# Patient Record
Sex: Male | Born: 1955 | Race: White | Hispanic: No | Marital: Married | Smoking: Former smoker
Health system: Southern US, Community
[De-identification: ages and names within clinical notes are randomized; demographics above are authoritative.]

## PROBLEM LIST (undated history)

## (undated) DIAGNOSIS — I2510 Atherosclerotic heart disease of native coronary artery without angina pectoris: Secondary | ICD-10-CM

## (undated) DIAGNOSIS — IMO0002 Reserved for concepts with insufficient information to code with codable children: Secondary | ICD-10-CM

## (undated) DIAGNOSIS — G8929 Other chronic pain: Secondary | ICD-10-CM

## (undated) DIAGNOSIS — M549 Dorsalgia, unspecified: Secondary | ICD-10-CM

## (undated) DIAGNOSIS — F419 Anxiety disorder, unspecified: Secondary | ICD-10-CM

## (undated) DIAGNOSIS — M1990 Unspecified osteoarthritis, unspecified site: Secondary | ICD-10-CM

## (undated) HISTORY — DX: Unspecified osteoarthritis, unspecified site: M19.90

## (undated) HISTORY — PX: FOOT SURGERY: SHX648

## (undated) HISTORY — DX: Other chronic pain: G89.29

## (undated) HISTORY — DX: Dorsalgia, unspecified: M54.9

## (undated) HISTORY — DX: Anxiety disorder, unspecified: F41.9

## (undated) HISTORY — DX: Reserved for concepts with insufficient information to code with codable children: IMO0002

## (undated) HISTORY — DX: Atherosclerotic heart disease of native coronary artery without angina pectoris: I25.10

---

## 2010-12-25 ENCOUNTER — Emergency Department (HOSPITAL_COMMUNITY): Payer: PRIVATE HEALTH INSURANCE

## 2010-12-25 ENCOUNTER — Encounter: Payer: Self-pay | Admitting: *Deleted

## 2010-12-25 ENCOUNTER — Other Ambulatory Visit: Payer: Self-pay

## 2010-12-25 ENCOUNTER — Emergency Department (HOSPITAL_COMMUNITY)
Admission: EM | Admit: 2010-12-25 | Discharge: 2010-12-26 | Disposition: A | Discharge status: Home / Self Care | Payer: PRIVATE HEALTH INSURANCE | Attending: Emergency Medicine | Admitting: Emergency Medicine

## 2010-12-25 DIAGNOSIS — R55 Syncope and collapse: Secondary | ICD-10-CM | POA: Insufficient documentation

## 2010-12-25 DIAGNOSIS — R002 Palpitations: Secondary | ICD-10-CM | POA: Insufficient documentation

## 2010-12-25 LAB — CBC
HCT: 43.2 % (ref 39.0–52.0)
Hemoglobin: 15 g/dL (ref 13.0–17.0)
MCH: 29.8 pg (ref 26.0–34.0)
MCHC: 34.7 g/dL (ref 30.0–36.0)
MCV: 85.7 fL (ref 78.0–100.0)
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
## 2010-12-25 NOTE — ED Notes (Signed)
Patient states was walking and felt heart begin racing and then he woke up on the ground. A&O; skin w/d. Respirations even and unlabored;able to speak in complete sentences without difficulty.

## 2010-12-25 NOTE — ED Notes (Signed)
Patient placed on monitor; sinus tachycardia with rate of 102.

## 2010-12-26 LAB — DIFFERENTIAL
Basophils Relative: 0 % (ref 0–1)
Eosinophils Absolute: 0.2 10*3/uL (ref 0.0–0.7)
Monocytes Absolute: 0.7 10*3/uL (ref 0.1–1.0)
Neutro Abs: 10 10*3/uL — ABNORMAL HIGH (ref 1.7–7.7)

## 2010-12-26 LAB — BASIC METABOLIC PANEL
BUN: 15 mg/dL (ref 6–23)
Creatinine, Ser: 0.94 mg/dL (ref 0.50–1.35)
GFR calc Af Amer: >60 mL/min (ref >60)
GFR calc non Af Amer: >60 mL/min (ref >60)
Glucose, Bld: 102 mg/dL — ABNORMAL HIGH (ref 70–99)

## 2010-12-26 NOTE — ED Notes (Signed)
Patient lying on stretcher; sister remains at bedside.  Patient denies pain at this time.
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
## 2011-05-23 ENCOUNTER — Telehealth: Payer: Self-pay

## 2011-05-23 NOTE — Telephone Encounter (Signed)
Pt not having any problems, on the road a lot. Would like to have appt in March. He will call back in Feb.

## 2011-08-21 ENCOUNTER — Telehealth: Payer: Self-pay

## 2011-08-21 NOTE — Telephone Encounter (Signed)
Pt was referred by Dr. Regino Schultze in December 2012 for screening colonoscopy. He wanted to wait until March. I called and he is checking on his schedule. Hopes to get next week off. Will know by tomorrow and then will call back.

## 2011-08-29 NOTE — Progress Notes (Signed)
I made error in the appt for today. Pt is rescheduled for 08/30/2011 with Lorenza Burton, NP, @ 8:30 AM.

## 2011-08-29 NOTE — Telephone Encounter (Signed)
LATE ENTRY: Spoke to pt on 08/21/2011 and he was trying to get time off for colonoscopy. Said he would call back to schedule.  I called again this AM (08/29/2011) and LMOM for pt to call.

## 2011-08-29 NOTE — Telephone Encounter (Signed)
Pt returned call. Would like to have colonoscopy this week if possible. Scheduled for OV with Lorenza Burton, NP at 3:30 pm today due to meds.

## 2011-08-30 ENCOUNTER — Encounter: Payer: Self-pay | Admitting: Urgent Care

## 2011-08-30 ENCOUNTER — Encounter (HOSPITAL_COMMUNITY): Payer: Self-pay | Admitting: Pharmacy Technician

## 2011-08-30 ENCOUNTER — Ambulatory Visit (INDEPENDENT_AMBULATORY_CARE_PROVIDER_SITE_OTHER): Payer: PRIVATE HEALTH INSURANCE | Admitting: Urgent Care

## 2011-08-30 ENCOUNTER — Telehealth: Payer: Self-pay | Admitting: Gastroenterology

## 2011-08-30 VITALS — BP 125/80 | HR 76 | Temp 98.3°F | Ht 68.0 in | Wt 200.2 lb

## 2011-08-30 DIAGNOSIS — Z79899 Other long term (current) drug therapy: Secondary | ICD-10-CM | POA: Insufficient documentation

## 2011-08-30 DIAGNOSIS — Z1211 Encounter for screening for malignant neoplasm of colon: Secondary | ICD-10-CM

## 2011-08-30 DIAGNOSIS — Z5189 Encounter for other specified aftercare: Secondary | ICD-10-CM

## 2011-08-30 MED ORDER — PEG-KCL-NACL-NASULF-NA ASC-C 100 G PO SOLR: 1.0000 | Freq: Once | ORAL | Status: DC

## 2011-08-30 NOTE — Progress Notes (Signed)
Faxed to PCP

## 2011-08-30 NOTE — Assessment & Plan Note (Signed)
Given his chronic narcotic use as well as concomitant anxiolytic use, he will be given Phenergan 25 mg IV 30 minutes prior to his procedure to help augment his conscious sedation.

## 2011-08-30 NOTE — Telephone Encounter (Signed)
Received call from Philip Patrick @ [facility]- She called to verify patient's insurance and was told that his plan does NOT cover a screening colonoscopy therefore the patient would be fully responsible for all charges. I have left a message on his VM regarding this-

## 2011-08-30 NOTE — Assessment & Plan Note (Signed)
Philip Patrick is a pleasant 56 y.o. male due for screening colonoscopy. He denies any GI complaints at this time.I have discussed risks & benefits which include, but are not limited to, bleeding, infection, perforation & drug reaction.  The patient agrees with this plan & written consent will be obtained.

## 2011-08-30 NOTE — Progress Notes (Addendum)
Referring Provider: Dr. Regino Schultze Primary Care Physician:  Kirk Ruths, MD, MD Primary Gastroenterologist:  Dr. Jena Gauss  Chief Complaint  Patient presents with  . Colonoscopy    Screening  . Medication Management    Chronic narcotics for chronic arthritis/back pain, hip pain, knee pain   HPI:  Philip Patrick is a 56 y.o. male here as a referral from Dr. Regino Schultze for screening colonoscopy. Upon further triage from the nurse, he was noted to be on several narcotic medications for his chronic back pain and arthritic joint pain, as well as anxiolytic therapy. He was brought into the office to further discuss appropriate sedation for his procedure.   Denies any lower GI symptoms including constipation, diarrhea, rectal bleeding, melena or weight loss.   Denies any upper GI symptoms including heartburn, indigestion, nausea, vomiting, dysphagia, odynophagia or anorexia.  Past Medical History  Diagnosis Date  . Osteoarthritis   . DDD (degenerative disc disease)     hip, knees  . Chronic back pain   . CAD (coronary artery disease)   . Anxiety     Past Surgical History  Procedure Date  . Foot surgery     left    Current Outpatient Prescriptions  Medication Sig Dispense Refill  . ALPRAZolam (XANAX) 1 MG tablet Take 1 mg by mouth at bedtime as needed.      Marland Kitchen atorvastatin (LIPITOR) 10 MG tablet Take 10 mg by mouth daily.      Marland Kitchen doxycycline (DORYX) 100 MG EC tablet Take 100 mg by mouth 2 (two) times daily.      Marland Kitchen HYDROcodone-acetaminophen (LORTAB 10) 10-500 MG per tablet Take 1 tablet by mouth every 6 (six) hours as needed.       . NON FORMULARY        . peg 3350 powder (MOVIPREP) 100 G SOLR Take 1 kit (100 g total) by mouth once. As directed Please purchase 1 Fleets enema to use with the prep  1 kit  0    Allergies as of 08/30/2011  . (No Known Allergies)    Family History:There is no known family history of colorectal carcinoma or inflammatory bowel disease.  Problem Relation Age of  Onset  . Breast cancer Mother   . Cirrhosis Father     etoh  . Lung cancer Father     History   Social History  . Marital Status: Married    Spouse Name: N/A    Number of Children: 0  . Years of Education: N/A   Occupational History  . [employer] doctor    Social History Main Topics  . Smoking status: Former Smoker -- 1.0 packs/day for 25 years    Types: Cigarettes    Quit date: 06/19/1994  . Smokeless tobacco: Former Neurosurgeon    Quit date: 07/01/1994  . Alcohol Use: Yes     2 beers a week sometimes not that  . Drug Use: No     remote marijuana  . Sexually Active: Not on file   Other Topics Concern  . Not on file   Social History Narrative   Pt divorcedLives alone  Review of Systems: Gen: Denies any fever, chills, sweats, anorexia, fatigue, weakness, malaise, weight loss, and sleep disorder CV: Denies chest pain, angina, palpitations, syncope, orthopnea, PND, peripheral edema, and claudication. Resp: Denies dyspnea at rest, dyspnea with exercise, cough, sputum, wheezing, coughing up blood, and pleurisy. GI: Denies vomiting blood, jaundice, and fecal incontinence.   GU : Denies urinary burning, blood in urine,  urinary frequency, urinary hesitancy, nocturnal urination, and urinary incontinence. MS: Denies joint pain, limitation of movement, and swelling, stiffness, low back pain, extremity pain. Denies muscle weakness, cramps, atrophy.  Derm: Denies rash, itching, dry skin, hives, moles, warts, or unhealing ulcers.  Psych: Denies depression, anxiety, memory loss, suicidal ideation, hallucinations, paranoia, and confusion. Heme: Denies bruising, bleeding, and enlarged lymph nodes. Neuro:  Denies any headaches, dizziness, paresthesias. Endo:  Denies any problems with DM, thyroid, adrenal function.  Physical Exam: BP 125/80  Pulse 76  Temp(Src) 98.3 F (36.8 C) (Temporal)  Ht 5\' 8"  (1.727 m)  Wt 200 lb 3.2 oz (90.81 kg)  BMI 30.44 kg/m2 General:   Alert,  Well-developed,  well-nourished, pleasant and cooperative in NAD Head:  Normocephalic and atraumatic. Eyes:  Sclera clear, no icterus.   Conjunctiva pink. Ears:  Normal auditory acuity. Nose:  No deformity, discharge, or lesions. Mouth:  No deformity or lesions,oropharynx pink & moist. Neck:  Supple; no masses or thyromegaly. Lungs:  Clear throughout to auscultation.   No wheezes, crackles, or rhonchi. No acute distress. Heart:  Regular rate and rhythm; no murmurs, clicks, rubs,  or gallops. Abdomen:  Normal bowel sounds.  No bruits.  He has a small easily reducible umbilical hernia which is nontender. Soft, non-tender and non-distended without hepatosplenomegaly.   No guarding or rebound tenderness.   Rectal:  Deferred. Msk:  Symmetrical without gross deformities. Normal posture. Pulses:  Normal pulses noted. Extremities:  No clubbing or edema. Neurologic:  Alert and oriented x4;  grossly normal neurologically. Skin:  Intact without significant lesions or rashes. Lymph Nodes:  No significant cervical adenopathy. Psych:  Alert and cooperative. Normal mood and affect.

## 2011-08-30 NOTE — Telephone Encounter (Signed)
Pt aware procedure time moved up to 1:40 and to arrive at 12:40

## 2011-09-01 ENCOUNTER — Ambulatory Visit (HOSPITAL_COMMUNITY)
Admission: RE | Admit: 2011-09-01 | Payer: PRIVATE HEALTH INSURANCE | Source: Ambulatory Visit | Admitting: Internal Medicine

## 2011-09-01 ENCOUNTER — Encounter (HOSPITAL_COMMUNITY): Admission: RE | Payer: Self-pay | Source: Ambulatory Visit

## 2011-09-01 SURGERY — COLONOSCOPY
Anesthesia: Moderate Sedation

## 2011-09-05 ENCOUNTER — Ambulatory Visit: Payer: PRIVATE HEALTH INSURANCE | Admitting: Urgent Care

## 2015-06-01 ENCOUNTER — Encounter: Payer: Self-pay | Admitting: Internal Medicine

## 2015-06-24 ENCOUNTER — Ambulatory Visit: Payer: PRIVATE HEALTH INSURANCE | Admitting: Gastroenterology

## 2019-12-16 ENCOUNTER — Emergency Department (HOSPITAL_COMMUNITY): Payer: PRIVATE HEALTH INSURANCE

## 2019-12-16 ENCOUNTER — Other Ambulatory Visit: Payer: Self-pay

## 2019-12-16 ENCOUNTER — Encounter (HOSPITAL_COMMUNITY): Payer: Self-pay | Admitting: Emergency Medicine

## 2019-12-16 ENCOUNTER — Emergency Department (HOSPITAL_COMMUNITY)
Admission: EM | Admit: 2019-12-16 | Discharge: 2019-12-16 | Disposition: A | Discharge status: Home / Self Care | Payer: PRIVATE HEALTH INSURANCE | Attending: Emergency Medicine | Admitting: Emergency Medicine

## 2019-12-16 DIAGNOSIS — H2102 Hyphema, left eye: Secondary | ICD-10-CM | POA: Insufficient documentation

## 2019-12-16 DIAGNOSIS — M79642 Pain in left hand: Secondary | ICD-10-CM | POA: Diagnosis not present

## 2019-12-16 DIAGNOSIS — Y998 Other external cause status: Secondary | ICD-10-CM | POA: Diagnosis not present

## 2019-12-16 DIAGNOSIS — Z87891 Personal history of nicotine dependence: Secondary | ICD-10-CM | POA: Diagnosis not present

## 2019-12-16 DIAGNOSIS — S0990XA Unspecified injury of head, initial encounter: Secondary | ICD-10-CM | POA: Insufficient documentation

## 2019-12-16 DIAGNOSIS — Y929 Unspecified place or not applicable: Secondary | ICD-10-CM | POA: Diagnosis not present

## 2019-12-16 DIAGNOSIS — M25512 Pain in left shoulder: Secondary | ICD-10-CM | POA: Diagnosis not present

## 2019-12-16 DIAGNOSIS — I2510 Atherosclerotic heart disease of native coronary artery without angina pectoris: Secondary | ICD-10-CM | POA: Insufficient documentation

## 2019-12-16 DIAGNOSIS — Y9389 Activity, other specified: Secondary | ICD-10-CM | POA: Diagnosis not present

## 2019-12-16 DIAGNOSIS — S0993XA Unspecified injury of face, initial encounter: Secondary | ICD-10-CM | POA: Diagnosis present

## 2019-12-16 DIAGNOSIS — Z79899 Other long term (current) drug therapy: Secondary | ICD-10-CM | POA: Insufficient documentation

## 2019-12-16 LAB — COMPREHENSIVE METABOLIC PANEL
ALT: 51 U/L — ABNORMAL HIGH (ref 0–44)
AST: 38 U/L (ref 15–41)
Albumin: 4.4 g/dL (ref 3.5–5.0)
Alkaline Phosphatase: 86 U/L (ref 38–126)
Anion gap: 11 (ref 5–15)
BUN: 17 mg/dL (ref 8–23)
CO2: 22 mmol/L (ref 22–32)
Calcium: 8.8 mg/dL — ABNORMAL LOW (ref 8.9–10.3)
Chloride: 105 mmol/L (ref 98–111)
Creatinine, Ser: 0.85 mg/dL (ref 0.61–1.24)
GFR calc Af Amer: >60 mL/min (ref >60)
GFR calc non Af Amer: >60 mL/min (ref >60)
Glucose, Bld: 145 mg/dL — ABNORMAL HIGH (ref 70–99)
Potassium: 3.9 mmol/L (ref 3.5–5.1)
Sodium: 138 mmol/L (ref 135–145)
Total Bilirubin: 0.5 mg/dL (ref 0.3–1.2)
Total Protein: 7.3 g/dL (ref 6.5–8.1)

## 2019-12-16 LAB — CBC WITH DIFFERENTIAL/PLATELET
Abs Immature Granulocytes: 0.02 10*3/uL (ref 0.00–0.07)
Basophils Absolute: 0 10*3/uL (ref 0.0–0.1)
Basophils Relative: 1 %
Eosinophils Absolute: 0.3 10*3/uL (ref 0.0–0.5)
Eosinophils Relative: 5 %
HCT: 46.8 % (ref 39.0–52.0)
Hemoglobin: 15.5 g/dL (ref 13.0–17.0)
Immature Granulocytes: 0 %
Lymphocytes Relative: 27 %
Lymphs Abs: 1.6 10*3/uL (ref 0.7–4.0)
MCH: 29.3 pg (ref 26.0–34.0)
MCHC: 33.1 g/dL (ref 30.0–36.0)
MCV: 88.5 fL (ref 80.0–100.0)
Monocytes Absolute: 0.5 10*3/uL (ref 0.1–1.0)
Monocytes Relative: 8 %
Neutro Abs: 3.5 10*3/uL (ref 1.7–7.7)
Neutrophils Relative %: 59 %
Platelets: 227 10*3/uL (ref 150–400)
RBC: 5.29 MIL/uL (ref 4.22–5.81)
RDW: 12.5 % (ref 11.5–15.5)
WBC: 6 10*3/uL (ref 4.0–10.5)
nRBC: 0 % (ref 0.0–0.2)

## 2019-12-16 LAB — ETHANOL: Alcohol, Ethyl (B): <10 mg/dL (ref <10)

## 2019-12-16 MED ORDER — SODIUM CHLORIDE 0.9 % IV BOLUS
500.0000 mL | Freq: Once | INTRAVENOUS | Status: AC
  Administered 2019-12-16: 500 mL via INTRAVENOUS

## 2019-12-16 NOTE — Discharge Instructions (Addendum)
Follow-up with your family doctor for your shoulder.  Follow-up with Dr. Burgess Estelle tomorrow morning at 830.  Keep your head elevated at least 45 degrees.  Do not lie flat.  Do not take any aspirin or ibuprofen.
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
## 2019-12-16 NOTE — ED Notes (Signed)
Pt ambulated to BR with assistance.
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
## 2020-06-16 ENCOUNTER — Encounter (INDEPENDENT_AMBULATORY_CARE_PROVIDER_SITE_OTHER): Payer: Self-pay | Admitting: *Deleted

## 2020-09-23 ENCOUNTER — Ambulatory Visit (INDEPENDENT_AMBULATORY_CARE_PROVIDER_SITE_OTHER): Payer: PRIVATE HEALTH INSURANCE | Admitting: Gastroenterology
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
